# Patient Record
Sex: Female | Born: 1990 | Race: White | Hispanic: No | Marital: Single | Smoking: Never smoker
Health system: Southern US, Community
[De-identification: ages and names within clinical notes are randomized; demographics above are authoritative.]

---
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
## 2014-08-30 ENCOUNTER — Encounter: Payer: Self-pay | Admitting: Women's Health

## 2014-08-30 ENCOUNTER — Ambulatory Visit (INDEPENDENT_AMBULATORY_CARE_PROVIDER_SITE_OTHER): Payer: BC Managed Care – PPO | Admitting: Women's Health

## 2014-08-30 ENCOUNTER — Other Ambulatory Visit (HOSPITAL_COMMUNITY)
Admission: RE | Admit: 2014-08-30 | Discharge: 2014-08-30 | Disposition: A | Discharge status: Home / Self Care | Payer: BC Managed Care – PPO | Source: Ambulatory Visit | Attending: Gynecology | Admitting: Gynecology

## 2014-08-30 VITALS — BP 110/70 | Ht 68.0 in | Wt 159.0 lb

## 2014-08-30 DIAGNOSIS — Z01419 Encounter for gynecological examination (general) (routine) without abnormal findings: Secondary | ICD-10-CM

## 2014-08-30 LAB — CBC WITH DIFFERENTIAL/PLATELET
BASOS ABS: 0 10*3/uL (ref 0.0–0.1)
BASOS PCT: 0 % (ref 0–1)
EOS PCT: 1 % (ref 0–5)
Eosinophils Absolute: 0.1 10*3/uL (ref 0.0–0.7)
HCT: 36.6 % (ref 36.0–46.0)
Hemoglobin: 12.4 g/dL (ref 12.0–15.0)
LYMPHS PCT: 32 % (ref 12–46)
Lymphs Abs: 2 10*3/uL (ref 0.7–4.0)
MCH: 31.5 pg (ref 26.0–34.0)
MCHC: 33.9 g/dL (ref 30.0–36.0)
MCV: 92.9 fL (ref 78.0–100.0)
MONO ABS: 0.5 10*3/uL (ref 0.1–1.0)
MPV: 9.6 fL (ref 8.6–12.4)
Monocytes Relative: 8 % (ref 3–12)
NEUTROS ABS: 3.6 10*3/uL (ref 1.7–7.7)
Neutrophils Relative %: 59 % (ref 43–77)
PLATELETS: 242 10*3/uL (ref 150–400)
RBC: 3.94 MIL/uL (ref 3.87–5.11)
RDW: 13.5 % (ref 11.5–15.5)
WBC: 6.1 10*3/uL (ref 4.0–10.5)

## 2014-08-30 NOTE — Patient Instructions (Signed)

## 2014-08-30 NOTE — Progress Notes (Signed)
Leilani Ablelexis Mcdevitt 04/28/1991 [id number]    History:    Presents for annual exam. First partner/both, condoms. Denies need for other contraception methods or Plan B. LMP 08/03/14, normal 3-4 day cycle. Declines Gardasil. Has not had a Pap smear.  Past medical history, past surgical history, family history and social history were all reviewed and documented in the EPIC chart. 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 Parents  healthy.  ROS:  A ROS was performed and pertinent positives and negatives are included.  Exam:  Filed Vitals:   08/30/14 1126  BP: 110/70    General appearance:  Normal Thyroid:  Symmetrical, normal in size, without palpable masses or nodularity. Respiratory  Auscultation:  Clear without wheezing or rhonchi Cardiovascular  Auscultation:  Regular rate, without rubs, murmurs or gallops  Edema/varicosities:  Not grossly evident Abdominal  Soft,nontender, without masses, guarding or rebound.  Liver/spleen:  No organomegaly noted  Hernia:  None appreciated  Skin  Inspection:  Grossly normal   Breasts: Examined lying and sitting.     Right: Without masses, retractions, discharge or axillary adenopathy.     Left: Without masses, retractions, discharge or axillary adenopathy. Gentitourinary   Inguinal/mons:  Normal without inguinal adenopathy  External genitalia:  Normal  BUS/Urethra/Skene's glands:  Normal  Vagina:  Normal  Cervix:  Normal  Uterus:  Normal in size, shape and contour.  Midline and mobile  Adnexa/parametria:     Rt: Without masses or tenderness.   Lt: Without masses or tenderness.  Anus and perineum: Normal   Assessment/Plan:  24 y.o.  MWF G0 for annual exam without complaints.      Normal GYN exam    Plan: SBE's, MVI, continue heart healthy exercise and diet encouraged. Contraception options reviewed and declined will continue condoms.  Reviewed Gardasil, took home education, will  call if wants to schedule. Pap , reviewed new guidelines.  UA, CBC.  Harrington ChallengerYOUNG,NANCY J WHNP, 12:06 PM 08/30/2014

## 2014-08-31 LAB — URINALYSIS W MICROSCOPIC + REFLEX CULTURE
Bilirubin Urine: NEGATIVE
CASTS: NONE SEEN
CRYSTALS: NONE SEEN
GLUCOSE, UA: NEGATIVE mg/dL
HGB URINE DIPSTICK: NEGATIVE
Ketones, ur: NEGATIVE mg/dL
NITRITE: NEGATIVE
PROTEIN: NEGATIVE mg/dL
SPECIFIC GRAVITY, URINE: 1.006 (ref 1.005–1.030)
Squamous Epithelial / LPF: NONE SEEN
UROBILINOGEN UA: 0.2 mg/dL (ref 0.0–1.0)
pH: 8 (ref 5.0–8.0)

## 2014-09-02 ENCOUNTER — Other Ambulatory Visit: Payer: Self-pay | Admitting: Women's Health

## 2014-09-02 DIAGNOSIS — N3000 Acute cystitis without hematuria: Secondary | ICD-10-CM

## 2014-09-02 LAB — URINE CULTURE

## 2014-09-04 LAB — CYTOLOGY - PAP

## 2017-01-05 ENCOUNTER — Encounter: Payer: Self-pay | Admitting: Gynecology
